# Patient Record
Sex: Female | Born: 1975 | Race: White | Hispanic: Yes | Marital: Single | Smoking: Never smoker
Health system: Southern US, Community
[De-identification: ages and names within clinical notes are randomized; demographics above are authoritative.]

## PROBLEM LIST (undated history)

## (undated) DIAGNOSIS — T7840XA Allergy, unspecified, initial encounter: Secondary | ICD-10-CM

## (undated) HISTORY — DX: Allergy, unspecified, initial encounter: T78.40XA

---

## 2008-06-26 ENCOUNTER — Emergency Department (HOSPITAL_COMMUNITY): Admission: EM | Admit: 2008-06-26 | Discharge: 2008-06-26 | Payer: Self-pay | Admitting: Emergency Medicine

## 2010-05-22 LAB — URINALYSIS, ROUTINE W REFLEX MICROSCOPIC
Bilirubin Urine: NEGATIVE
Ketones, ur: NEGATIVE mg/dL
Nitrite: NEGATIVE
Urobilinogen, UA: 0.2 mg/dL (ref 0.0–1.0)

## 2010-05-22 LAB — CBC
HCT: 38.1 % (ref 36.0–46.0)
MCV: 88.3 fL (ref 78.0–100.0)
Platelets: 184 10*3/uL (ref 150–400)
WBC: 4.8 10*3/uL (ref 4.0–10.5)

## 2010-05-22 LAB — COMPREHENSIVE METABOLIC PANEL
ALT: 14 U/L (ref 0–35)
Alkaline Phosphatase: 39 U/L (ref 39–117)
CO2: 26 mEq/L (ref 19–32)
GFR calc non Af Amer: >60 mL/min (ref >60)
Glucose, Bld: 92 mg/dL (ref 70–99)
Potassium: 3.6 mEq/L (ref 3.5–5.1)
Sodium: 144 mEq/L (ref 135–145)
Total Protein: 7.3 g/dL (ref 6.0–8.3)

## 2010-05-22 LAB — DIFFERENTIAL
Eosinophils Absolute: 0 10*3/uL (ref 0.0–0.7)
Eosinophils Relative: 1 % (ref 0–5)
Lymphs Abs: 1.4 10*3/uL (ref 0.7–4.0)
Monocytes Absolute: 0.3 10*3/uL (ref 0.1–1.0)

## 2010-05-22 LAB — LIPASE, BLOOD: Lipase: 25 U/L (ref 11–59)

## 2010-05-22 LAB — GC/CHLAMYDIA PROBE AMP, GENITAL: GC Probe Amp, Genital: NEGATIVE

## 2010-05-22 LAB — POCT PREGNANCY, URINE: Preg Test, Ur: NEGATIVE

## 2010-05-22 LAB — WET PREP, GENITAL: WBC, Wet Prep HPF POC: NONE SEEN

## 2010-09-17 IMAGING — US US ART/VEN ABD/PELV/SCROTUM DOPPLER COMPLETE
1 series · 14 of 25 positions shown · non-contrast
Comparison: None

CLINICAL DATA: Left pelvic/adnexal pain.

TRANSABDOMINAL AND TRANSVAGINAL ULTRASOUND OF PELVIS
DOPPLER ULTRASOUND OF OVARIES
TECHNIQUE: Both transabdominal and transvaginal ultrasound
examinations of the pelvis were performed including evaluation of
the uterus, ovaries, adnexal regions, and pelvic cul-de-sac. Color
and duplex Doppler ultrasound was utilized to evaluate blood flow
to the ovaries.

[Series 1: us art/ven abd/pelv/scrotum doppler complete · 0.24mm/px · 14 of 35 slices shown]
[im 1/35]
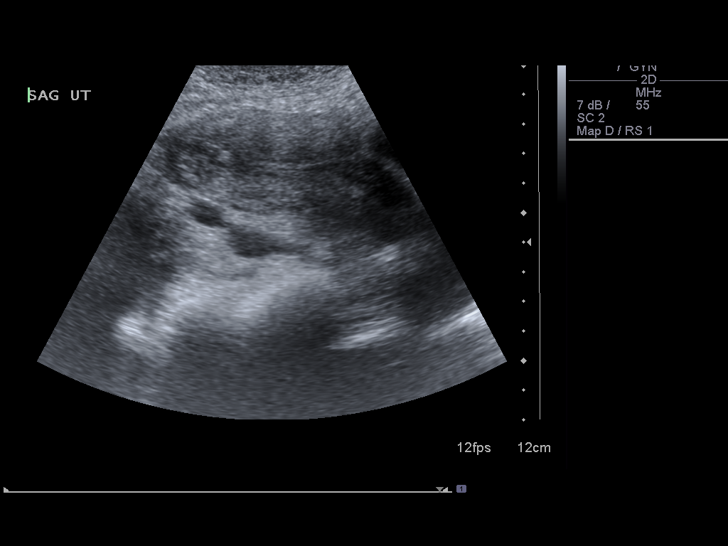
[im 3/35]
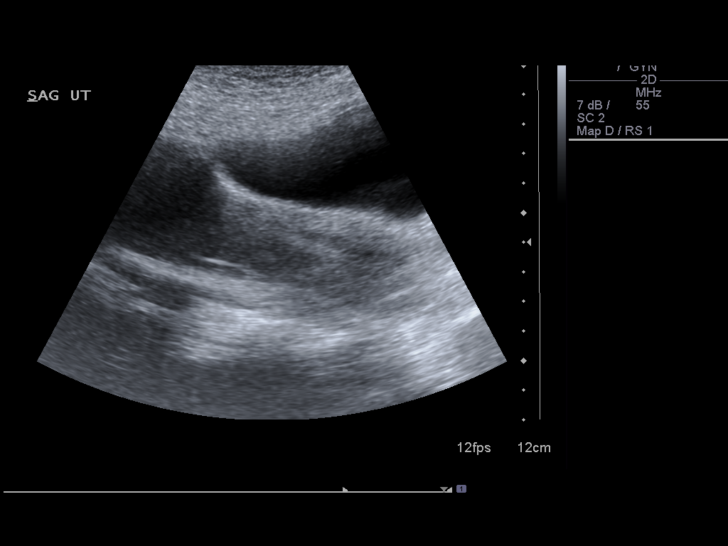
[im 6/35]
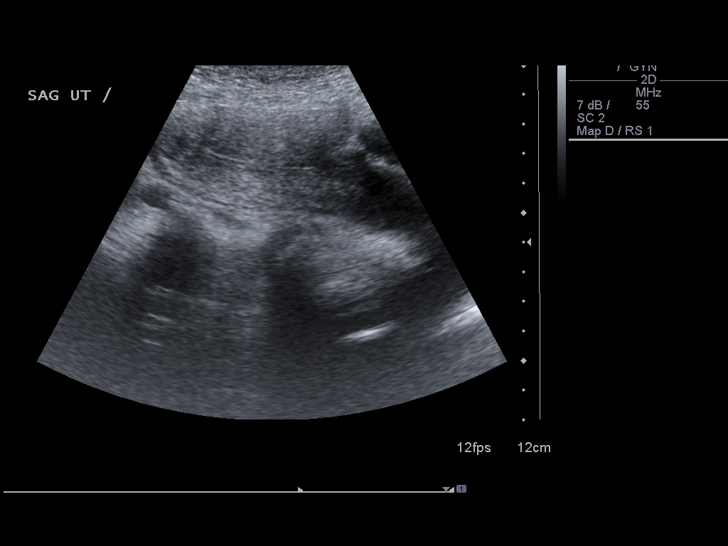
[im 9/35]
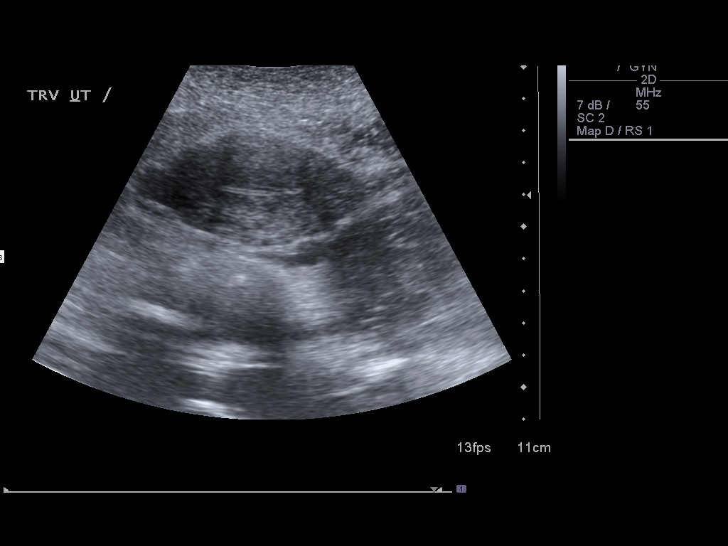
[im 12/35]
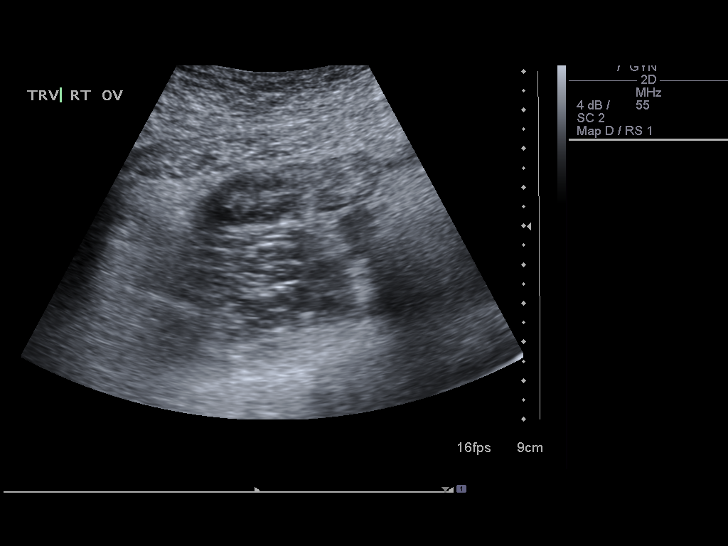
[im 13/35]
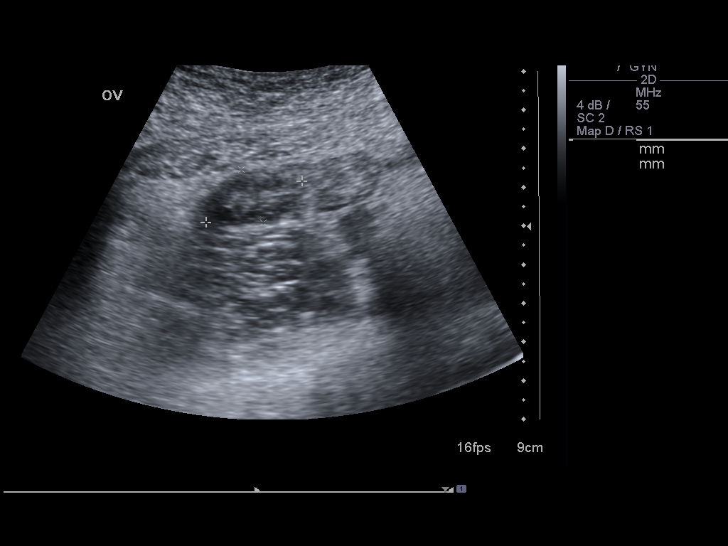
[im 16/35]
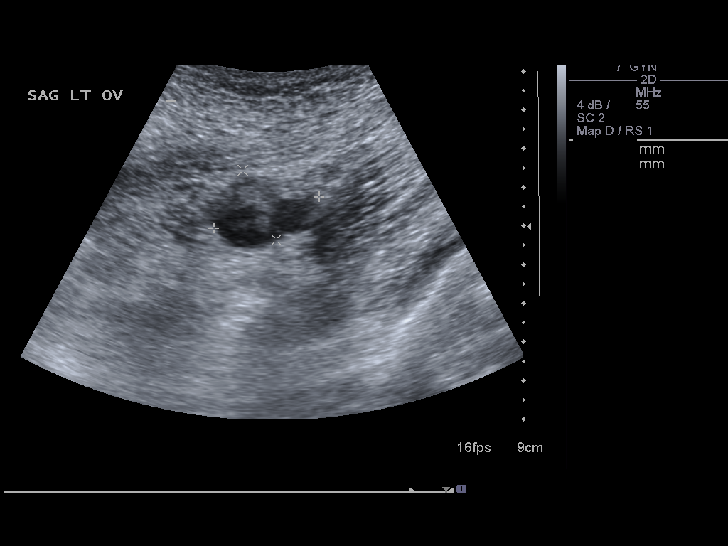
[im 19/35]
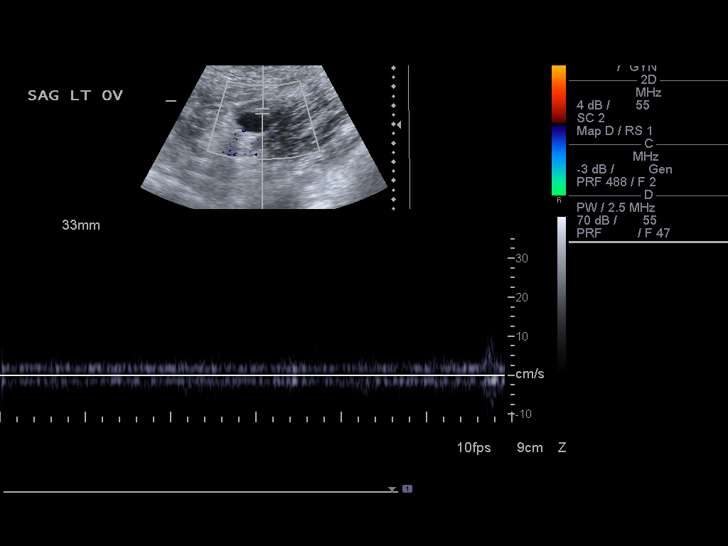
[im 22/35]
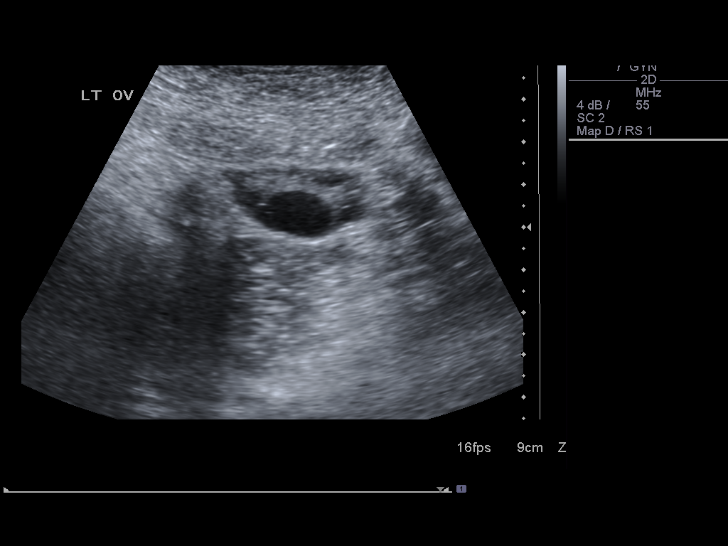
[im 23/35]
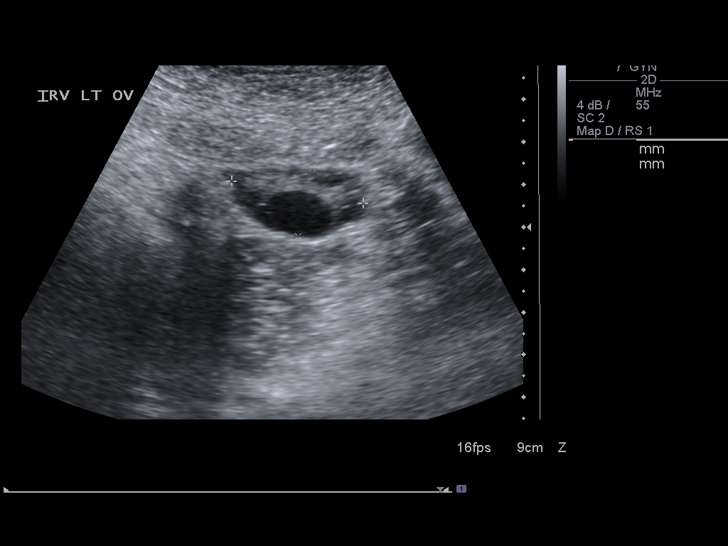
[im 26/35]
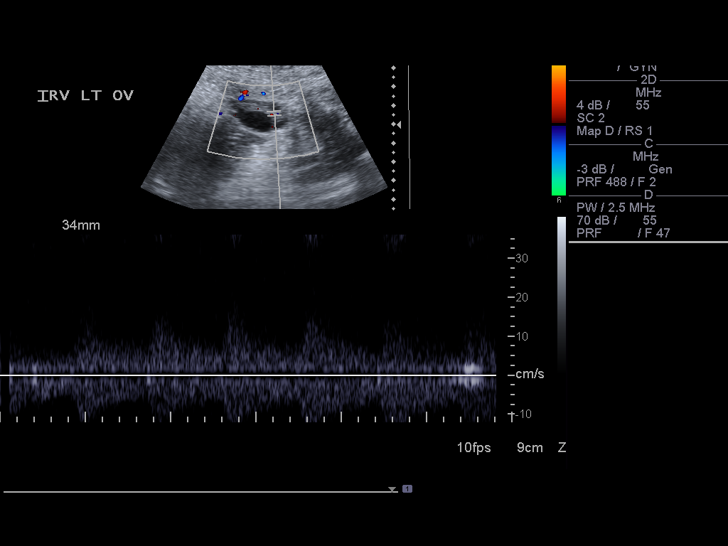
[im 29/35]
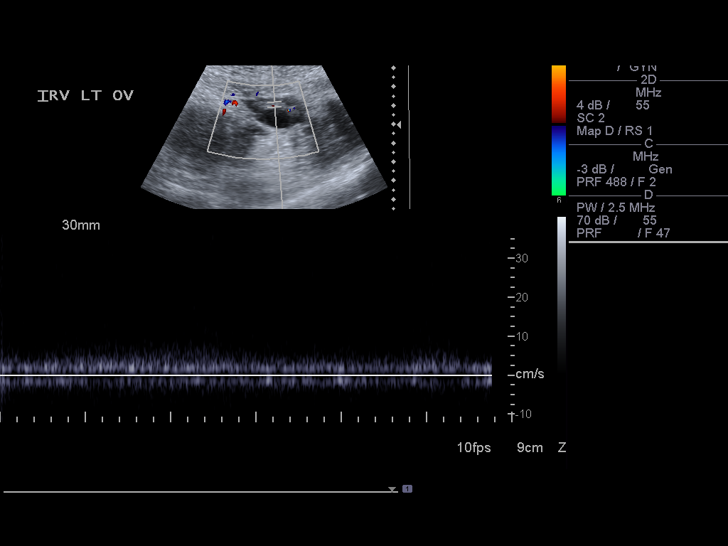
[im 32/35]
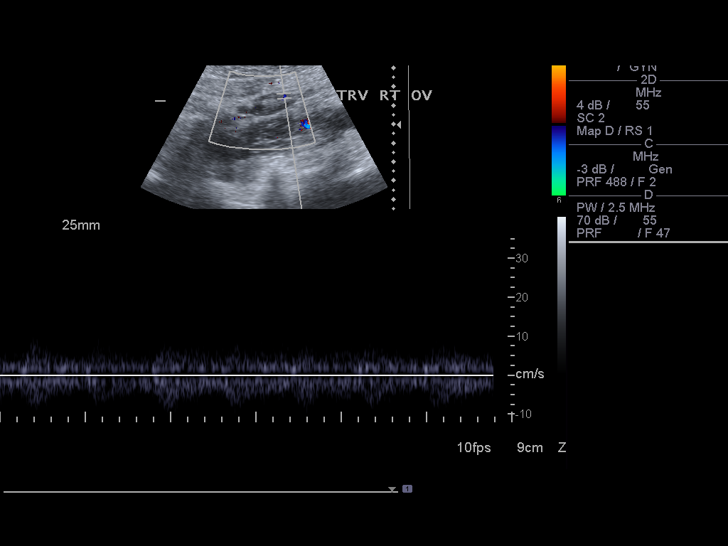
[im 35/35]
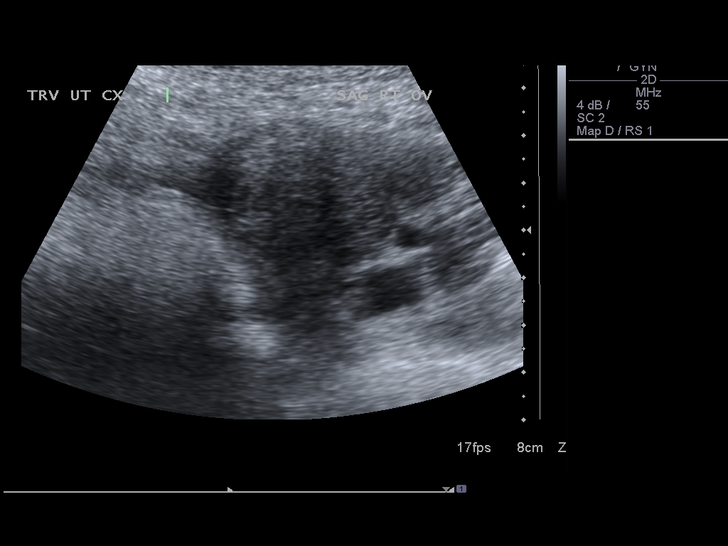

[14 of 25 positions shown; findings below may reference images not displayed]

FINDINGS: Uterus: Uterine dimensions are approximately 11.7 x 5.3 x 3.5 cm.
Sonographic appearance of the uterus is unremarkable.

Endometrium:  Endometrium is homogeneous and measures approximately
5 mm in thickness.

Right Ovary:  Right ovarian dimensions are approximately 2.9 x
x 1.5 cm.  Normal follicles present.  Doppler demonstrates normal
arterial inflow and venous outflow wave forms.

Left Ovary:  Left ovarian dimensions are approximately 3.1 x 2.8 x
2.0 cm.  Normal small follicles present.  Doppler analysis shows
normal arterial inflow and venous outflow.

Other Findings:  No free fluid identified in the pelvis.
IMPRESSION: Normal pelvic ultrasound.  No evidence of ovarian torsion.

## 2012-01-31 ENCOUNTER — Encounter (HOSPITAL_COMMUNITY): Payer: Self-pay | Admitting: Adult Health

## 2012-01-31 ENCOUNTER — Emergency Department (HOSPITAL_COMMUNITY)
Admission: EM | Admit: 2012-01-31 | Discharge: 2012-02-01 | Disposition: A | Discharge status: Home / Self Care | Payer: Self-pay | Attending: Emergency Medicine | Admitting: Emergency Medicine

## 2012-01-31 DIAGNOSIS — R109 Unspecified abdominal pain: Secondary | ICD-10-CM

## 2012-01-31 DIAGNOSIS — R112 Nausea with vomiting, unspecified: Secondary | ICD-10-CM | POA: Insufficient documentation

## 2012-01-31 DIAGNOSIS — Z3202 Encounter for pregnancy test, result negative: Secondary | ICD-10-CM | POA: Insufficient documentation

## 2012-01-31 DIAGNOSIS — R509 Fever, unspecified: Secondary | ICD-10-CM | POA: Insufficient documentation

## 2012-01-31 DIAGNOSIS — R1011 Right upper quadrant pain: Secondary | ICD-10-CM | POA: Insufficient documentation

## 2012-01-31 LAB — COMPREHENSIVE METABOLIC PANEL
ALT: 64 U/L — ABNORMAL HIGH (ref 0–35)
AST: 33 U/L (ref 0–37)
Albumin: 4 g/dL (ref 3.5–5.2)
Alkaline Phosphatase: 60 U/L (ref 39–117)
BUN: 6 mg/dL (ref 6–23)
Chloride: 104 mEq/L (ref 96–112)
Potassium: 3.6 mEq/L (ref 3.5–5.1)
Sodium: 139 mEq/L (ref 135–145)
Total Bilirubin: 0.6 mg/dL (ref 0.3–1.2)

## 2012-01-31 LAB — URINALYSIS, MICROSCOPIC ONLY
Bilirubin Urine: NEGATIVE
Hgb urine dipstick: NEGATIVE
Ketones, ur: NEGATIVE mg/dL
Nitrite: NEGATIVE
Urobilinogen, UA: 1 mg/dL (ref 0.0–1.0)
pH: 7 (ref 5.0–8.0)

## 2012-01-31 LAB — POCT PREGNANCY, URINE: Preg Test, Ur: NEGATIVE

## 2012-01-31 LAB — CBC WITH DIFFERENTIAL/PLATELET
Basophils Absolute: 0 10*3/uL (ref 0.0–0.1)
Eosinophils Absolute: 0 10*3/uL (ref 0.0–0.7)
Eosinophils Relative: 1 % (ref 0–5)
HCT: 37.4 % (ref 36.0–46.0)
MCH: 28.7 pg (ref 26.0–34.0)
MCV: 84.4 fL (ref 78.0–100.0)
Monocytes Absolute: 0.5 10*3/uL (ref 0.1–1.0)
Platelets: 192 10*3/uL (ref 150–400)
RDW: 14 % (ref 11.5–15.5)

## 2012-01-31 LAB — LIPASE, BLOOD: Lipase: 16 U/L (ref 11–59)

## 2012-01-31 MED ORDER — MORPHINE SULFATE 4 MG/ML IJ SOLN
4.0000 mg | Freq: Once | INTRAMUSCULAR | Status: AC
  Administered 2012-01-31: 4 mg via INTRAVENOUS
  Filled 2012-01-31: qty 1

## 2012-01-31 MED ORDER — ONDANSETRON HCL 4 MG/2ML IJ SOLN
4.0000 mg | Freq: Once | INTRAMUSCULAR | Status: AC
  Administered 2012-01-31: 4 mg via INTRAVENOUS
  Filled 2012-01-31: qty 2

## 2012-01-31 NOTE — ED Provider Notes (Signed)
History     CSN: [id number]  Arrival date & time 01/31/12  2142   First MD Initiated Contact with Patient 01/31/12 2318      Chief Complaint  Patient presents with  . Abdominal Pain    (Consider location/radiation/quality/duration/timing/severity/associated sxs/prior treatment) HPI 36 year old female presents to emergency room with complaint of right upper quadrant pain associated with subjective fevers, nausea and vomiting for the last 2 days. Patient reports she's been unable to eat due to nausea and vomiting. Patient had subjective fevers yesterday. No prior history of similar pain. No prior surgeries. No sick contacts, no diarrhea.  History reviewed. No pertinent past medical history.  History reviewed. No pertinent past surgical history.  History reviewed. No pertinent family history.  History  Substance Use Topics  . Smoking status: Never Smoker   . Smokeless tobacco: Not on file  . Alcohol Use: No    OB History    Grav Para Term Preterm Abortions TAB SAB Ect Mult Living                  Review of Systems  Unable to perform ROS  language barrier  Allergies  Review of patient's allergies indicates no known allergies.  Home Medications  No current outpatient prescriptions on file.  BP 116/84  Pulse 82  Resp 18  SpO2 100%  Physical Exam  Nursing note and vitals reviewed. Constitutional: She is oriented to person, place, and time. She appears well-developed and well-nourished.  HENT:  Head: Normocephalic and atraumatic.  Nose: Nose normal.  Mouth/Throat: Oropharynx is clear and moist.  Eyes: Conjunctivae normal and EOM are normal. Pupils are equal, round, and reactive to light.  Neck: Normal range of motion. Neck supple. No JVD present. No tracheal deviation present. No thyromegaly present.  Cardiovascular: Normal rate, regular rhythm, normal heart sounds and intact distal pulses.  Exam reveals no gallop and no friction rub.   No murmur  heard. Pulmonary/Chest: Effort normal and breath sounds normal. No stridor. No respiratory distress. She has no wheezes. She has no rales. She exhibits no tenderness.  Abdominal: Soft. Bowel sounds are normal. She exhibits no distension and no mass. There is tenderness (patient with significant tenderness in right upper quadrant and epigastrium). There is no rebound and no guarding.  Musculoskeletal: Normal range of motion. She exhibits no edema and no tenderness.  Lymphadenopathy:    She has no cervical adenopathy.  Neurological: She is alert and oriented to person, place, and time. She exhibits normal muscle tone. Coordination normal.  Skin: Skin is warm and dry. No rash noted. No erythema. No pallor.  Psychiatric: She has a normal mood and affect. Her behavior is normal. Judgment and thought content normal.    ED Course  Procedures (including critical care time)  Labs Reviewed  COMPREHENSIVE METABOLIC PANEL - Abnormal; Notable for the following:    ALT 64 (*)     All other components within normal limits  URINALYSIS, MICROSCOPIC ONLY - Abnormal; Notable for the following:    Squamous Epithelial / LPF MANY (*)     All other components within normal limits  CBC WITH DIFFERENTIAL  LIPASE, BLOOD  POCT PREGNANCY, URINE   US Abdomen Complete  02/01/2012  *RADIOLOGY REPORT*  Clinical Data:  Right upper quadrant pain, fever.  COMPLETE ABDOMINAL ULTRASOUND  Comparison:  None.  Findings:  Gallbladder:  No gallstones, gallbladder wall thickening, or pericholecystic fluid.  Common bile duct:  Measures 3 mm, within normal limits.  Liver:  Diffusely increased in echogenicity.  Focal lesion detection is limited in the setting.  IVC:  Appears normal.  Pancreas:  Poorly visualized to overlying bowel gas artifact.  Spleen:  Measures 7 cm oblique.  No focal abnormality.  Right Kidney:  Measures 8.5 cm.  No hydronephrosis or focal abnormality.  Left Kidney:  Measures 9.7 cm.  No hydronephrosis or focal  abnormality.  Abdominal aorta:  No aneurysm identified.  IMPRESSION: No gallstones or sonographic evidence for cholecystitis.  Diffusely increased hepatic echogenicity is most in keeping with hepatic steatosis.  Incompletely/poorly visualized pancreas due to the limited acoustic windows/bowel gas artifact.   Original Report Authenticated By: Jearld Lesch, M.D.      1. Abdominal pain, acute   2. Nausea and vomiting       MDM  36 year old female with right upper quadrant pain. Suspect gallbladder disease cholelithiasis versus cholecystitis. Will order ultrasound for further diagnosis, we'll treat pain and nausea.   4:31 AM No signs of gallbladder disease.  Labs unremarkable.  Pain controlled, tolerating pos.  Will have her f/u with pcm, gi        Olivia Mackie, MD 02/01/12 [phone]

## 2012-01-31 NOTE — ED Notes (Signed)
Pt unable to use the bathroom at this time 

## 2012-01-31 NOTE — ED Notes (Signed)
Presents with RUQ abdominal pain that began yesterday associated with nausea and vomiting. Denies diarrhea. Pain is described as sharp. Pt is spanish speaking.

## 2012-02-01 ENCOUNTER — Emergency Department (HOSPITAL_COMMUNITY): Payer: Self-pay

## 2012-02-01 MED ORDER — ONDANSETRON HCL 4 MG PO TABS: 4.0000 mg | ORAL_TABLET | Freq: Four times a day (QID) | ORAL | Status: DC

## 2012-02-01 MED ORDER — DICYCLOMINE HCL 20 MG PO TABS: 20.0000 mg | ORAL_TABLET | Freq: Four times a day (QID) | ORAL | Status: DC | PRN

## 2012-02-01 MED ORDER — HYDROMORPHONE HCL PF 1 MG/ML IJ SOLN
1.0000 mg | Freq: Once | INTRAMUSCULAR | Status: AC
  Administered 2012-02-01: 1 mg via INTRAVENOUS
  Filled 2012-02-01: qty 1

## 2012-02-01 MED ORDER — DICYCLOMINE HCL 10 MG/ML IM SOLN
20.0000 mg | Freq: Once | INTRAMUSCULAR | Status: AC
  Administered 2012-02-01: 20 mg via INTRAMUSCULAR
  Filled 2012-02-01: qty 2

## 2012-02-01 NOTE — ED Notes (Signed)
Pt tolerated gingerale well 

## 2012-02-01 NOTE — ED Notes (Signed)
Patient transported to Ultrasound 

## 2014-04-24 IMAGING — US US ABDOMEN COMPLETE
1 series · 14 of 25 positions shown · non-contrast
Comparison: None.

CLINICAL DATA: Right upper quadrant pain, fever.

COMPLETE ABDOMINAL ULTRASOUND

[Series 1: us abdomen complete · 0.25mm/px · 14 of 115 slices shown]
[im 1/115]
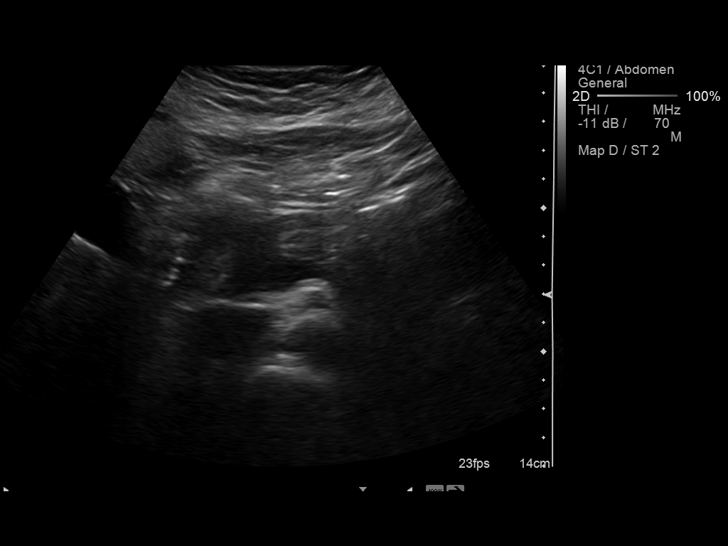
[im 10/115]
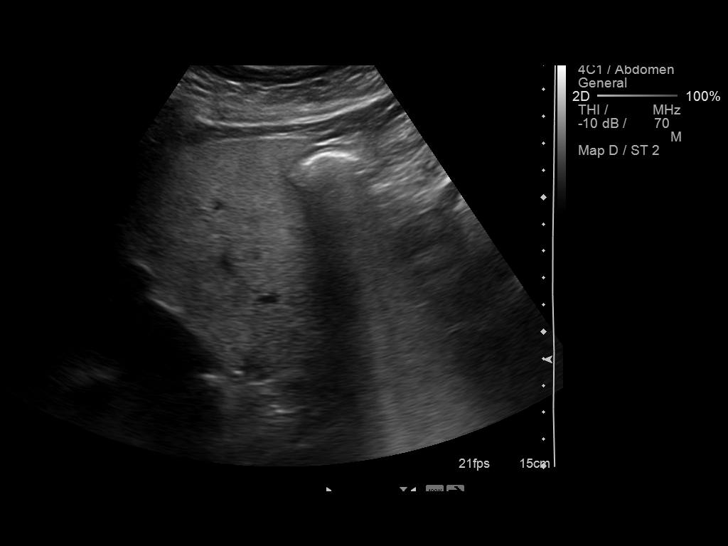
[im 20/115]
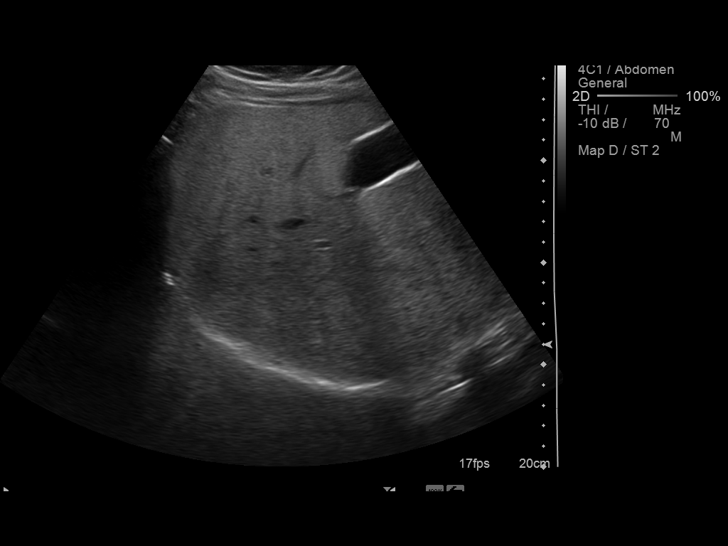
[im 29/115]
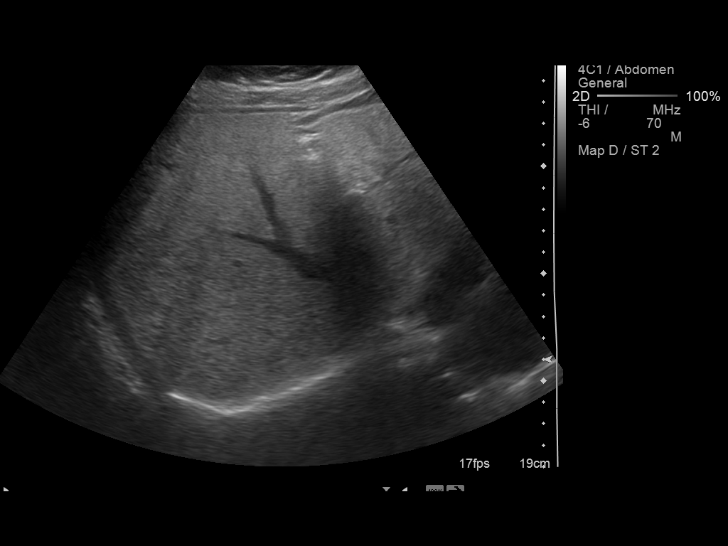
[im 39/115]
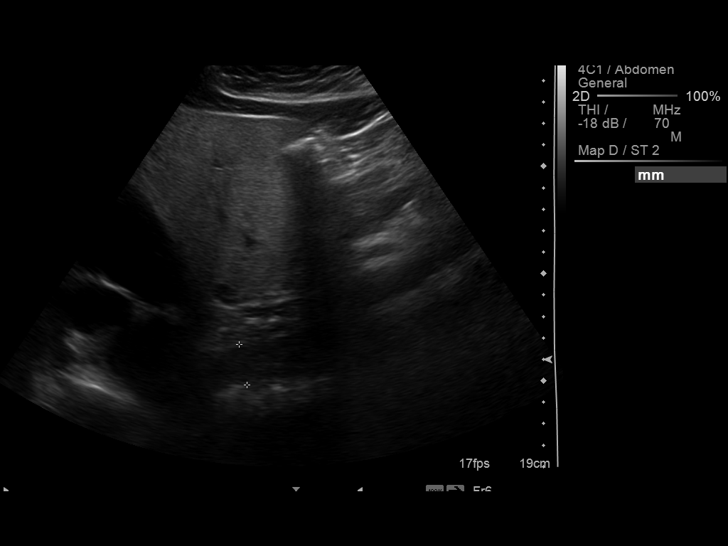
[im 43/115]
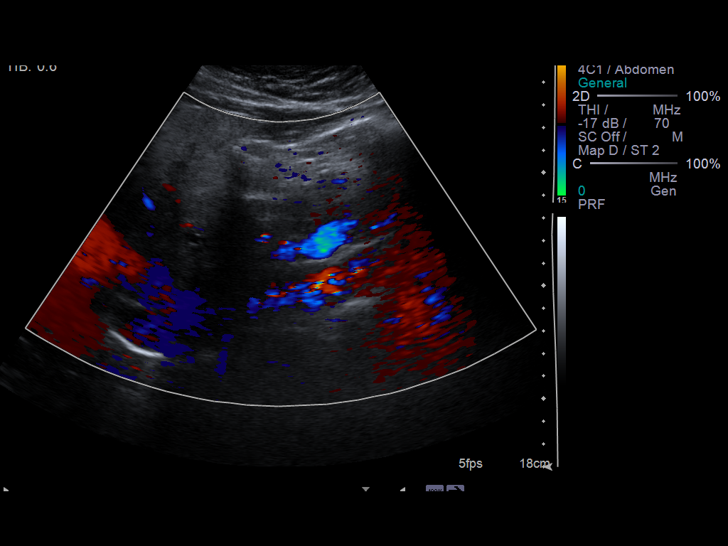
[im 53/115]
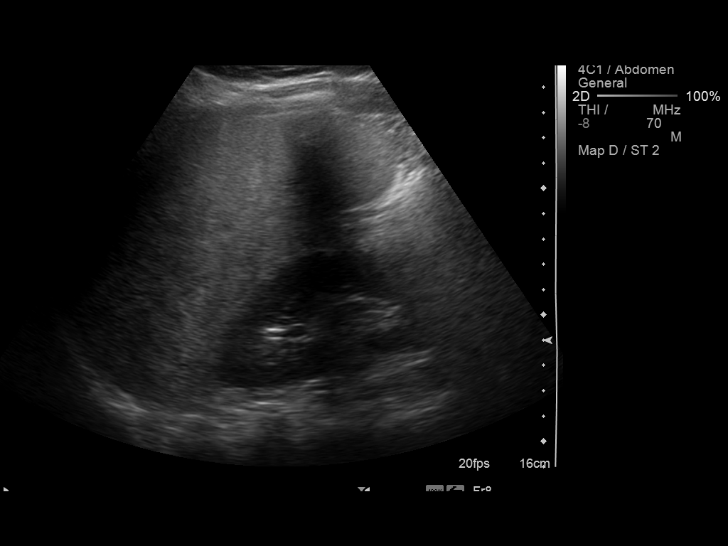
[im 62/115]
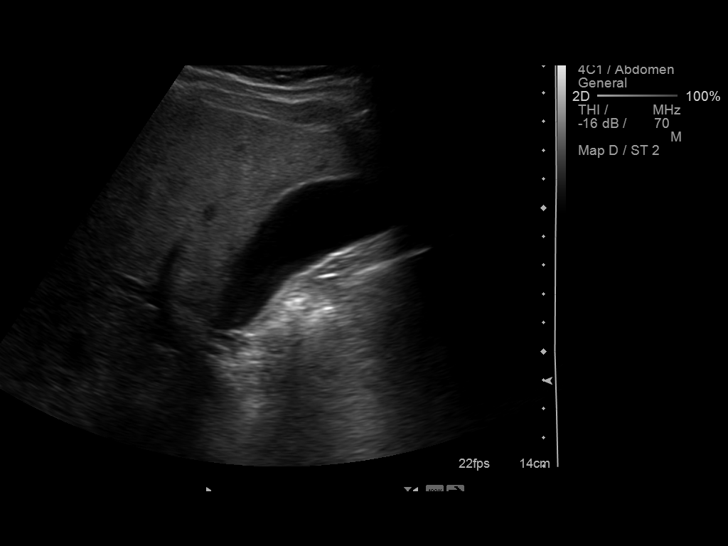
[im 72/115]
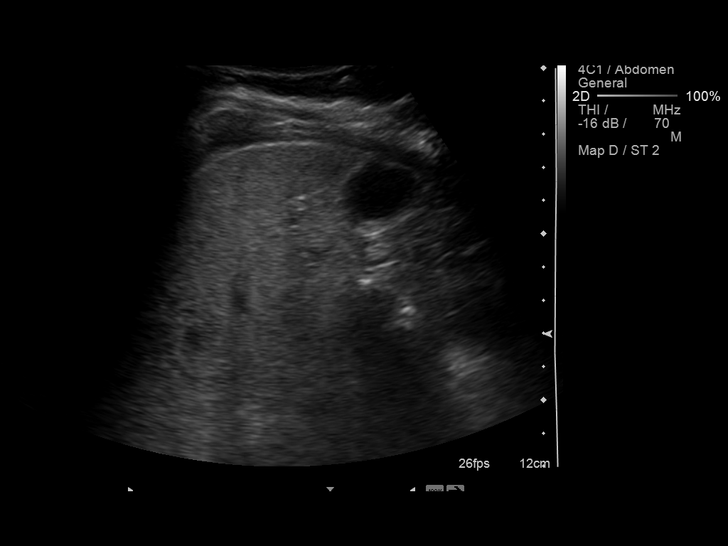
[im 77/115]
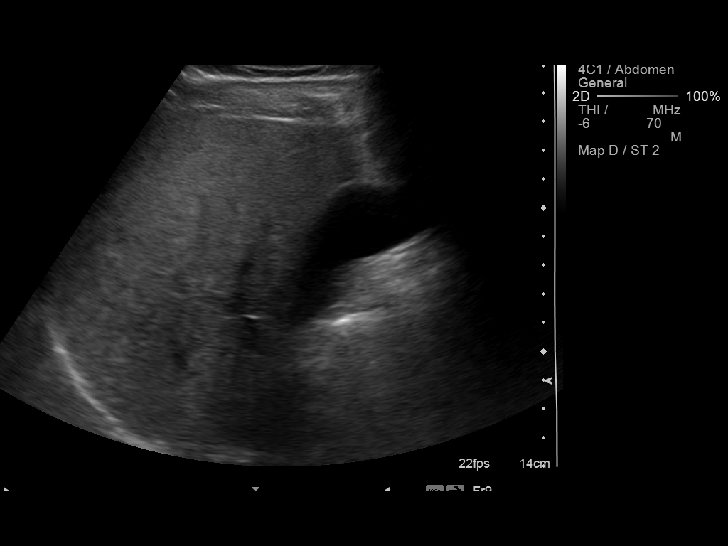
[im 86/115]
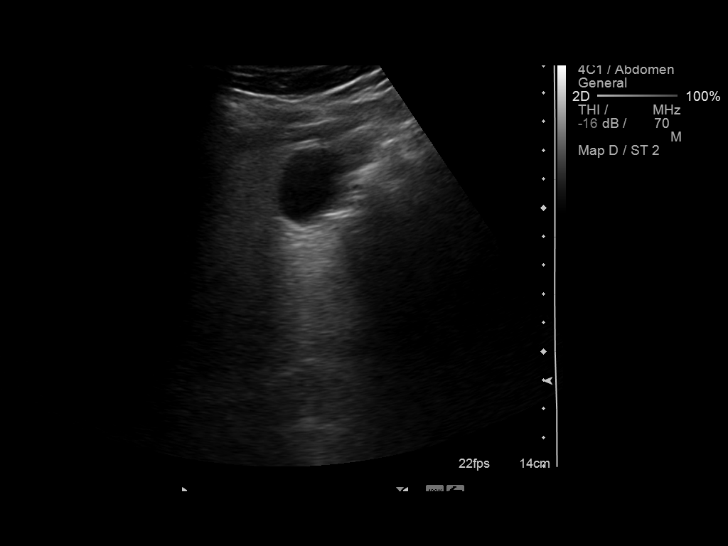
[im 96/115]
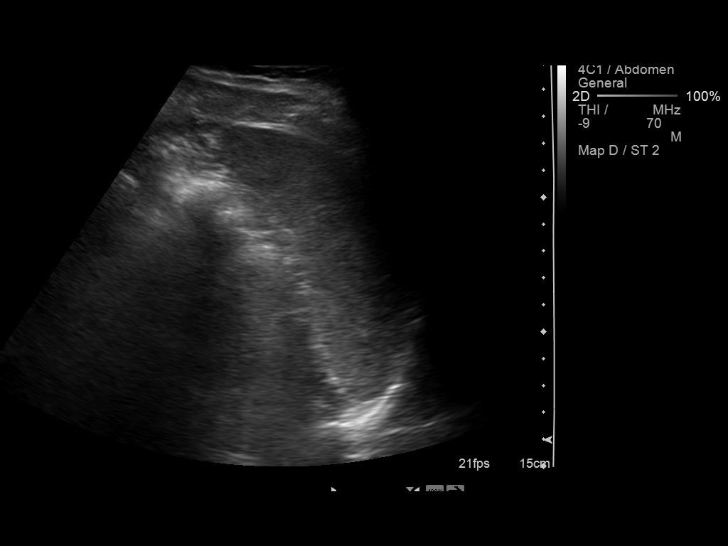
[im 105/115]
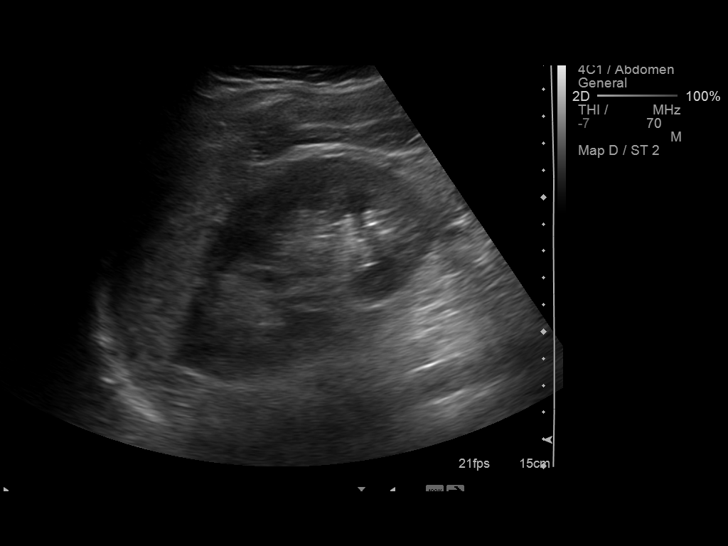
[im 115/115]
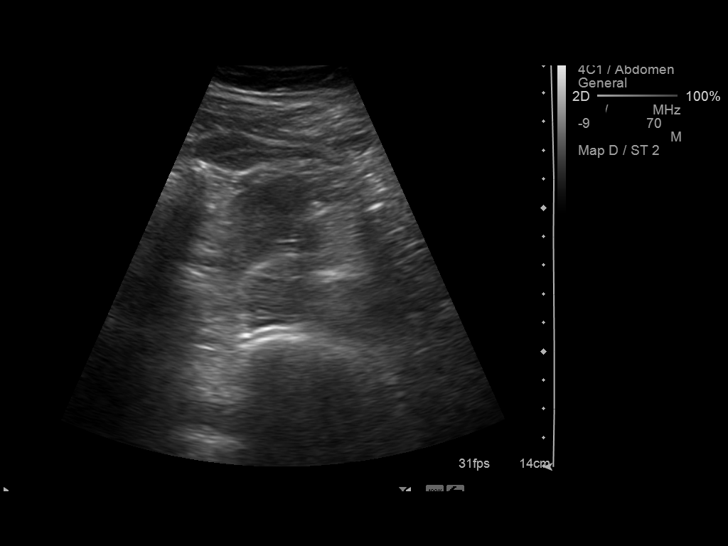

[14 of 25 positions shown; findings below may reference images not displayed]

FINDINGS: Gallbladder:  No gallstones, gallbladder wall thickening, or
pericholecystic fluid.

Common bile duct:  Measures 3 mm, within normal limits.

Liver:  Diffusely increased in echogenicity.  Focal lesion
detection is limited in the setting.

IVC:  Appears normal.

Pancreas:  Poorly visualized to overlying bowel gas artifact.

Spleen:  Measures 7 cm oblique.  No focal abnormality.

Right Kidney:  Measures 8.5 cm.  No hydronephrosis or focal
abnormality.

Left Kidney:  Measures 9.7 cm.  No hydronephrosis or focal
abnormality.

Abdominal aorta:  No aneurysm identified.
IMPRESSION: No gallstones or sonographic evidence for cholecystitis.

Diffusely increased hepatic echogenicity is most in keeping with
hepatic steatosis.

Incompletely/poorly visualized pancreas due to the limited acoustic
windows/bowel gas artifact.

## 2014-05-07 ENCOUNTER — Encounter (HOSPITAL_COMMUNITY): Payer: Self-pay | Admitting: *Deleted

## 2014-05-07 ENCOUNTER — Emergency Department (HOSPITAL_COMMUNITY)
Admission: EM | Admit: 2014-05-07 | Discharge: 2014-05-07 | Disposition: A | Discharge status: Home / Self Care | Payer: Self-pay | Attending: Emergency Medicine | Admitting: Emergency Medicine

## 2014-05-07 DIAGNOSIS — L03113 Cellulitis of right upper limb: Secondary | ICD-10-CM | POA: Insufficient documentation

## 2014-05-07 DIAGNOSIS — L237 Allergic contact dermatitis due to plants, except food: Secondary | ICD-10-CM | POA: Insufficient documentation

## 2014-05-07 MED ORDER — PREDNISONE 10 MG PO TABS: ORAL_TABLET | ORAL | Status: DC

## 2014-05-07 MED ORDER — CEPHALEXIN 500 MG PO CAPS: ORAL_CAPSULE | ORAL | Status: DC

## 2014-05-07 MED ORDER — HYDROXYZINE HCL 25 MG PO TABS: 25.0000 mg | ORAL_TABLET | Freq: Four times a day (QID) | ORAL | Status: DC | PRN

## 2014-05-07 NOTE — ED Notes (Signed)
The pt has had a rash over her body for 4 days.  They think it is poisosn ivy  lmp   now

## 2014-05-07 NOTE — Discharge Instructions (Signed)
Take Prednisone and vistaril as directed, and try using Calamine lotion to help with the rash. Continue your usual home medications. Get plenty of rest and drink plenty of fluids. Keep the area loosely covered with a bandage to help avoid spreading the rash. Take the antibiotic as directed to help with the infection. Follow up with [facility] in one week for recheck. Return to the ER for changes or worsening symptoms. Use tylenol or aleve as needed for pain.   Contact Dermatitis Contact dermatitis is a rash that happens when something touches the skin. You touched something that irritates your skin, or you have allergies to something you touched. HOME CARE   Avoid the thing that caused your rash.  Keep your rash away from hot water, soap, sunlight, chemicals, and other things that might bother it.  Do not scratch your rash.  You can take cool baths to help stop itching.  Only take medicine as told by your doctor.  Keep all doctor visits as told. GET HELP RIGHT AWAY IF:   Your rash is not better after 3 days.  Your rash gets worse.  Your rash is puffy (swollen), tender, red, sore, or warm.  You have problems with your medicine. MAKE SURE YOU:   Understand these instructions.  Will watch your condition.  Will get help right away if you are not doing well or get worse. Document Released: 11/25/2008 Document Revised: 04/22/2011 Document Reviewed: 07/03/2010 [facility] Patient Information 2015 [employer] [place]. This information is not intended to replace advice given to you by your health care provider. Make sure you discuss any questions you have with your health care provider.  Poison [employer] Poison ivy is a rash caused by touching the leaves of the poison ivy plant. The rash often shows up 48 hours later. You might just have bumps, redness, and itching. Sometimes, blisters appear and break open. Your eyes may get puffy (swollen). Poison ivy often heals in 2 to 3 weeks  without treatment. HOME CARE  If you touch poison ivy:  Wash your skin with soap and water right away. Wash under your fingernails. Do not rub the skin very hard.  Wash any clothes you were wearing.  Avoid poison ivy in the future. Poison ivy has 3 leaves on a stem.  Use medicine to help with itching as told by your doctor. Do not drive when you take this medicine.  Keep open sores dry, clean, and covered with a bandage and medicated cream, if needed.  Ask your doctor about medicine for children. GET HELP RIGHT AWAY IF:  You have open sores.  Redness spreads beyond the area of the rash.  There is yellowish white fluid (pus) coming from the rash.  Pain gets worse.  You have a temperature by mouth above 102 F (38.9 C), not controlled by medicine. MAKE SURE YOU:  Understand these instructions.  Will watch your condition.  Will get help right away if you are not doing well or get worse. Document Released: 03/02/2010 Document Revised: 04/22/2011 Document Reviewed: 03/02/2010 [facility] Patient Information 2015 [employer] [place]. This information is not intended to replace advice given to you by your health care provider. Make sure you discuss any questions you have with your health care provider.

## 2014-05-07 NOTE — ED Provider Notes (Signed)
CSN: [id number]     Arrival date & time 05/07/14  0145 History   First MD Initiated Contact with Patient 05/07/14 [phone]     Chief Complaint  Patient presents with  . Rash     (Consider location/radiation/quality/duration/timing/severity/associated sxs/prior Treatment) HPI Comments: Cathy Jenkins is a 39 y.o. Female who presents to the ED with complaints of rash over her right arm, right lower quadrant, and right upper thigh 4 days. She states that she was outside in her yard doing yard work and she has known poison ivy, and came into contact with it. The rashes itchy and has had "watery" droplets appear on the skin, without any purulent discharge. She has not tried anything prior to arrival, with no known aggravating or alleviating factors. She denies any fevers, chills, hoarse voice, difficulty swallowing, facial swelling, tongue or lip swelling, chest pain or shortness of breath, wheezing, abdominal pain, nausea, vomiting, diarrhea, numbness, tingling, or focal weakness. Denies warmth or red streaking. Denies any history of diabetes or immunocompromising diseases.  Patient is a 39 y.o. female presenting with rash. The history is provided by the patient. A language interpreter was used (family member).  Rash Location:  Shoulder/arm, torso and leg Shoulder/arm rash location:  R arm Torso rash location:  Abd RLQ Leg rash location:  R upper leg Quality: itchiness, redness and weeping ("water")   Quality: not draining and not swelling   Severity:  Moderate Onset quality:  Gradual Duration:  4 days Timing:  Constant Progression:  Spreading Chronicity:  New Context: plant contact   Relieved by:  None tried Worsened by:  Nothing tried Ineffective treatments:  None tried Associated symptoms: no abdominal pain, no diarrhea, no fever, no hoarse voice, no joint pain, no myalgias, no nausea, no periorbital edema, no shortness of breath, no sore throat, no throat swelling, no tongue swelling, not  vomiting and not wheezing     History reviewed. No pertinent past medical history. History reviewed. No pertinent past surgical history. No family history on file. History  Substance Use Topics  . Smoking status: Never Smoker   . Smokeless tobacco: Not on file  . Alcohol Use: No   OB History    No data available     Review of Systems  Constitutional: Negative for fever and chills.  HENT: Negative for facial swelling, hoarse voice, sore throat and trouble swallowing.   Eyes: Negative for redness.  Respiratory: Negative for shortness of breath and wheezing.   Cardiovascular: Negative for chest pain.  Gastrointestinal: Negative for nausea, vomiting, abdominal pain and diarrhea.  Musculoskeletal: Negative for myalgias and arthralgias.  Skin: Positive for rash.  Allergic/Immunologic: Negative for immunocompromised state.  Neurological: Negative for weakness and numbness.   10 Systems reviewed and are negative for acute change except as noted in the HPI.    Allergies  Review of patient's allergies indicates no known allergies.  Home Medications   Prior to Admission medications   Medication Sig Start Date End Date Taking? Authorizing Provider  dicyclomine (BENTYL) 20 MG tablet Take 1 tablet (20 mg total) by mouth every 6 (six) hours as needed (for abdominal cramping). Patient not taking: Reported on 05/07/2014 02/01/12   Marisa Severin, MD  ondansetron (ZOFRAN) 4 MG tablet Take 1 tablet (4 mg total) by mouth every 6 (six) hours. Patient not taking: Reported on 05/07/2014 02/01/12   Marisa Severin, MD   BP 107/57 mmHg  Pulse 64  Temp(Src) 98.2 F (36.8 C)  Resp 16  Wt 169  lb (76.658 kg)  SpO2 98%  LMP 05/07/2014 Physical Exam  Constitutional: She is oriented to person, place, and time. Vital signs are normal. She appears well-developed and well-nourished.  Non-toxic appearance. No distress.  Afebrile, nontoxic, NAD  HENT:  Head: Normocephalic and atraumatic.  Mouth/Throat:  Mucous membranes are normal.  Eyes: Conjunctivae and EOM are normal. Right eye exhibits no discharge. Left eye exhibits no discharge.  Neck: Normal range of motion. Neck supple.  Cardiovascular: Normal rate.   Pulmonary/Chest: Effort normal. No respiratory distress.  Abdominal: Normal appearance. She exhibits no distension.  Musculoskeletal: Normal range of motion.  FROM intact at all major joints of RUE Strength and sensation grossly intact in all extremities  Neurological: She is alert and oriented to person, place, and time. She has normal strength. No sensory deficit.  Skin: Skin is warm and dry. Rash noted. Rash is pustular.  Somewhat pustular rash with multiple excoriations noted to R wrist on volar aspect, extending up to mid-upper arm, erythematous with trace warmth at wrist without warmth through remainder of rash. Some similar appearing rash to RLQ region/R upper thigh anteriorly. No intertriginous or interdigital webspace involvement. No burrowing  Psychiatric: She has a normal mood and affect. Her behavior is normal.  Nursing note and vitals reviewed.   ED Course  Procedures (including critical care time) Labs Review Labs Reviewed - No data to display  Imaging Review No results found.   EKG Interpretation None      MDM   Final diagnoses:  Poison ivy dermatitis  Cellulitis of right upper extremity    39 y.o. female here with poison ivy dermatitis, small portion of which on R wrist seems to be secondarily infected. Discussed use of calamine lotion, vistaril, prednisone, and will give keflex for early infection. Discussed importance of avoidance of scratching since this can cause spreading. Will have her f/up with [facility] in 1 week. I explained the diagnosis and have given explicit precautions to return to the ER including for any other new or worsening symptoms. The patient understands and accepts the medical plan as it's been dictated and I have  answered their questions. Discharge instructions concerning home care and prescriptions have been given. The patient is STABLE and is discharged to home in good condition.  BP 104/64 mmHg  Pulse 77  Temp(Src) 98.2 F (36.8 C)  Resp 16  Wt 169 lb (76.658 kg)  SpO2 98%  LMP 05/07/2014  Meds ordered this encounter  Medications  . hydrOXYzine (ATARAX/VISTARIL) 25 MG tablet    Sig: Take 1 tablet (25 mg total) by mouth every 6 (six) hours as needed for itching.    Dispense:  28 tablet    Refill:  0    Order Specific Question:  Supervising Provider    Answer:  MILLER, BRIAN [[id number]]  . predniSONE (DELTASONE) 10 MG tablet    Sig: Take 60 mg PO QD x1 week, then 40 mg PO QD x 1 week    Dispense:  70 tablet    Refill:  0    Order Specific Question:  Supervising Provider    Answer:  MILLER, BRIAN [[id number]]  . cephALEXin (KEFLEX) 500 MG capsule    Sig: 2 caps po bid x 7 days    Dispense:  28 capsule    Refill:  0    Order Specific Question:  Supervising Provider    Answer:  Eber HongMILLER, BRIAN [[id number]]       Enis Leatherwood Camprubi-Soms, PA-C 05/07/14 [phone]  Dione Booze, MD 05/07/14 [phone]

## 2014-06-06 ENCOUNTER — Ambulatory Visit (INDEPENDENT_AMBULATORY_CARE_PROVIDER_SITE_OTHER): Payer: Self-pay | Admitting: Internal Medicine

## 2014-06-06 VITALS — BP 116/62 | HR 87 | Temp 97.8°F | Resp 16 | Ht 65.0 in | Wt 174.0 lb

## 2014-06-06 DIAGNOSIS — J301 Allergic rhinitis due to pollen: Secondary | ICD-10-CM

## 2014-06-06 DIAGNOSIS — J0120 Acute ethmoidal sinusitis, unspecified: Secondary | ICD-10-CM

## 2014-06-06 MED ORDER — CETIRIZINE HCL 10 MG PO TABS
10.0000 mg | ORAL_TABLET | Freq: Every day | ORAL | Status: DC
Start: 2014-06-06 — End: 2015-06-10

## 2014-06-06 MED ORDER — FLUTICASONE PROPIONATE 50 MCG/ACT NA SUSP: 2.0000 | Freq: Every day | NASAL | Status: AC

## 2014-06-06 MED ORDER — AZITHROMYCIN 250 MG PO TABS: ORAL_TABLET | ORAL | Status: DC

## 2014-06-06 NOTE — Addendum Note (Signed)
Addended by: Tama HeadingsBATES, Dodie Parisi A on: 06/06/2014 06:06 PM   Modules accepted: Orders, Medications

## 2014-06-06 NOTE — Patient Instructions (Signed)
Take meds as directed. Flonase nasal spray, Zyrtec for allergy symptoms , Zithromax as directed. Return if no improvement or if worsening of your symptoms.

## 2014-06-06 NOTE — Progress Notes (Signed)
   Subjective:    Patient ID: Cathy Jenkins, female    DOB: 10/16/1975, 39 y.o.   MRN: [id number]  HPI 39 year old Hispanic female with CC: Runny nose, itchy eyes, ears stuffed up and itching, mild headache HPI: Onset of symptoms 2 weeks ago,moderate in severity, constant, worsen when working 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 No fever, mild cough and scratchy throat, no sputum, no chest pain. Has not tried otc med's yet. Not breast feeding, no pregnant, lmp this week and was normal/    Review of Systems  Constitutional: Negative.   HENT: Positive for congestion, hearing loss, rhinorrhea, sinus pressure and sneezing. Negative for ear discharge, ear pain, facial swelling, nosebleeds and postnasal drip.   Respiratory: Positive for cough.   Cardiovascular: Negative.   Allergic/Immunologic: Positive for environmental allergies.  All other systems reviewed and are negative.      Objective:   Physical Exam  Constitutional: She is oriented to person, place, and time. She appears well-developed and well-nourished.  HENT:  Head: Normocephalic and atraumatic.  Right Ear: External ear normal.  Left Ear: External ear normal.  Nose: Nose normal.  Mouth/Throat: Oropharynx is clear and moist. No oropharyngeal exudate.  Nasal congestion and clear rhinorhhea Slight tenderness over the bridge of trhe nose and the frontal sinus area.  Eyes: Conjunctivae and EOM are normal. Pupils are equal, round, and reactive to light.  Neck: Normal range of motion. Neck supple.  Cardiovascular: Normal rate, regular rhythm, normal heart sounds and intact distal pulses.   Pulmonary/Chest: Effort normal and breath sounds normal.  Abdominal: Bowel sounds are normal.  Musculoskeletal: Normal range of motion.  Lymphadenopathy:    She has no cervical adenopathy.  Neurological: She is alert and oriented to person, place, and time.  Skin: Skin is warm and dry.  Psychiatric: She has a normal mood and affect. Her  behavior is normal. Judgment and thought content normal.  Nursing note and vitals reviewed.         Assessment & Plan:

## 2015-06-10 ENCOUNTER — Ambulatory Visit (INDEPENDENT_AMBULATORY_CARE_PROVIDER_SITE_OTHER): Payer: Self-pay | Admitting: Physician Assistant

## 2015-06-10 VITALS — BP 112/62 | HR 91 | Temp 98.1°F | Resp 18 | Ht 61.5 in | Wt 172.0 lb

## 2015-06-10 DIAGNOSIS — H547 Unspecified visual loss: Secondary | ICD-10-CM

## 2015-06-10 DIAGNOSIS — H5712 Ocular pain, left eye: Secondary | ICD-10-CM

## 2015-06-10 DIAGNOSIS — J301 Allergic rhinitis due to pollen: Secondary | ICD-10-CM

## 2015-06-10 DIAGNOSIS — H109 Unspecified conjunctivitis: Secondary | ICD-10-CM

## 2015-06-10 MED ORDER — CETIRIZINE HCL 10 MG PO TABS: 10.0000 mg | ORAL_TABLET | Freq: Every day | ORAL | Status: AC

## 2015-06-10 MED ORDER — POLYMYXIN B-TRIMETHOPRIM 10000-0.1 UNIT/ML-% OP SOLN: 1.0000 [drp] | OPHTHALMIC | Status: AC

## 2015-06-10 NOTE — Patient Instructions (Addendum)
Take zyrtec daily for allergies -- take for 2-4 weeks to get you through allergy season Use 1 drop in left eye every 4 hours for 7 days. You will get a phone call to make appt with ophthalmologist    IF you received an x-ray today, you will receive an invoice from [facility]. Please contact [facility] at [phone] with questions or concerns regarding your invoice.   IF you received labwork today, you will receive an invoice from [facility]. Please contact [facility] at [phone] with questions or concerns regarding your invoice.   Our billing staff will not be able to assist you with questions regarding bills from these companies.  You will be contacted with the lab results as soon as they are available. The fastest way to get your results is to activate your My Chart account. Instructions are located on the last page of this paperwork. If you have not heard from us regarding the results in 2 weeks, please contact this office.

## 2015-06-10 NOTE — Progress Notes (Signed)
[facility] [address] [place] [address] [phone]  Date:  06/10/2015   Name:  Cathy Jenkins   DOB:  1975-09-03   MRN:  [id number]  PCP:  Default, Provider, MD    Chief Complaint: Itchy Eye and Sore Throat   History of Present Illness:  This is a 40 y.o. female with PMH allergic rhinitis who is presenting with itchy nose and sore throat x 1 week. Throat feels scratchy and tight. Both eyes are itching and red. States the left one a little irritated and painful. Not much drainage. No vision change. No foreign body sensation. Does not wear glasses or contacts. She states over the past week her eye has stayed the same, no better or worse. Denies fever or chills. She does not feel bad. Last saw an eye doctor 5 years ago.  Denies cough, fever, chills, malaise. Tried flonase yesterday, not helping. Not taking anything else. No hx of asthma Not a smoker.  Review of Systems:  Review of Systems See HPI  There are no active problems to display for this patient.   Prior to Admission medications   Medication Sig Start Date End Date Taking? Authorizing Provider  fluticasone (FLONASE) 50 MCG/ACT nasal spray Place 2 sprays into both nostrils daily. 06/06/14  Yes Sheryl Edwina Barth, DO       Sheryl Edwina Barth, DO    No Known Allergies  Past Surgical History  Procedure Laterality Date  . Cesarean section      Social History  Substance Use Topics  . Smoking status: Never Smoker   . Smokeless tobacco: None  . Alcohol Use: No    History reviewed. No pertinent family history.  Medication list has been reviewed and updated.  Physical Examination:  Physical Exam  Constitutional: She is oriented to person, place, and time. She appears well-developed and well-nourished. No distress.  HENT:  Head: Normocephalic and atraumatic.  Right Ear: Hearing, tympanic membrane, external ear and ear canal normal.  Left Ear: Hearing, tympanic membrane, external ear and  ear canal normal.  Nose: Nose normal.  Mouth/Throat: Uvula is midline and mucous membranes are normal. Posterior oropharyngeal edema (2+ bilateral) present. No oropharyngeal exudate or posterior oropharyngeal erythema.  Eyes: EOM and lids are normal. Pupils are equal, round, and reactive to light. Right eye exhibits no discharge. Left eye exhibits no discharge. Right conjunctiva is not injected. Left conjunctiva is injected. No scleral icterus.  Bilateral pterygiums present Woods lamp of left eye with uptake on pterygium. No corneal uptake Pt endorses pain with palpation of globe  Cardiovascular: Normal rate, regular rhythm, normal heart sounds and normal pulses.   No murmur heard. Pulmonary/Chest: Effort normal and breath sounds normal. No respiratory distress. She has no wheezes. She has no rhonchi. She has no rales.  Musculoskeletal: Normal range of motion.  Lymphadenopathy:       Head (right side): No submental, no submandibular and no tonsillar adenopathy present.       Head (left side): No submental, no submandibular and no tonsillar adenopathy present.    She has no cervical adenopathy.  Neurological: She is alert and oriented to person, place, and time.  Skin: Skin is warm, dry and intact. No lesion and no rash noted.  Psychiatric: She has a normal mood and affect. Her speech is normal and behavior is normal. Thought content normal.   BP 112/62 mmHg  Pulse 91  Temp(Src) 98.1 F (36.7 C) (Oral)  Resp 18  Ht 5' 1.5" (1.562 m)  Wt 172 lb (78.019 kg)  BMI 31.98 kg/m2  SpO2 97%   Visual Acuity Screening   Right eye Left eye Both eyes  Without correction: 20/20 20/30 20/15   With correction:      Assessment and Plan:   1. Seasonal allergic rhinitis due to pollen Start on zyrtec daily for next 2-4 weeks to get through allergy season. - cetirizine (ZYRTEC) 10 MG tablet; Take 1 tablet (10 mg total) by mouth daily.  Dispense: 30 tablet; Refill: 11  2. Conjunctivitis of left  eye 3. Decreased vision 4. Eye pain, left Conjunctivitis less likely allergic since unilateral. Woods lamp negative. Vision is decreased in left eye compared to right. She does endorse pain with palpation on right globe. Will treat with polytrim every 4 hours in left eye for 7 days. Warm compresses. Referred to opthalmology for eval d/t vision change and globe pain. Return if symptoms significantly worsen before getting in with ophthalmology. Return precautions discussed. - trimethoprim-polymyxin b (POLYTRIM) ophthalmic solution; Place 1 drop into the left eye every 4 (four) hours.  Dispense: 10 mL; Refill: 0 - Ambulatory referral to Ophthalmology   Roswell MinersNicole V. Dyke BrackettBush, PA-C, MHS [facility]  06/10/2015
